# Patient Record
Sex: Male | Born: 2005 | Race: White | Hispanic: Yes | Marital: Single | Smoking: Never smoker
Health system: Southern US, Community
[De-identification: ages and names within clinical notes are randomized; demographics above are authoritative.]

## PROBLEM LIST (undated history)

## (undated) DIAGNOSIS — N4400 Torsion of testis, unspecified: Secondary | ICD-10-CM

## (undated) HISTORY — DX: Torsion of testis, unspecified: N44.00

## (undated) HISTORY — PX: ORCHIECTOMY: SHX2116

---
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
## 2024-02-02 DIAGNOSIS — H538 Other visual disturbances: Secondary | ICD-10-CM | POA: Diagnosis not present

## 2024-03-02 DIAGNOSIS — H5213 Myopia, bilateral: Secondary | ICD-10-CM | POA: Diagnosis not present

## 2024-04-10 DIAGNOSIS — H5213 Myopia, bilateral: Secondary | ICD-10-CM | POA: Diagnosis not present

## 2024-07-15 DIAGNOSIS — S61211A Laceration without foreign body of left index finger without damage to nail, initial encounter: Secondary | ICD-10-CM | POA: Diagnosis not present
